# Patient Record
Sex: Female | Born: 1960 | Hispanic: No | Marital: Married
Health system: Southern US, Community
[De-identification: ages and names within clinical notes are randomized; demographics above are authoritative.]

## PROBLEM LIST (undated history)

## (undated) DIAGNOSIS — R000 Tachycardia, unspecified: Secondary | ICD-10-CM

---

## 2020-01-27 ENCOUNTER — Emergency Department (HOSPITAL_COMMUNITY)
Admission: EM | Admit: 2020-01-27 | Discharge: 2020-01-27 | Disposition: A | Discharge status: Home / Self Care | Payer: No Typology Code available for payment source | Attending: Emergency Medicine | Admitting: Emergency Medicine

## 2020-01-27 ENCOUNTER — Encounter (HOSPITAL_COMMUNITY): Payer: Self-pay | Admitting: Emergency Medicine

## 2020-01-27 ENCOUNTER — Emergency Department (HOSPITAL_COMMUNITY): Payer: No Typology Code available for payment source

## 2020-01-27 DIAGNOSIS — Z79899 Other long term (current) drug therapy: Secondary | ICD-10-CM | POA: Diagnosis not present

## 2020-01-27 DIAGNOSIS — Y92410 Unspecified street and highway as the place of occurrence of the external cause: Secondary | ICD-10-CM | POA: Insufficient documentation

## 2020-01-27 DIAGNOSIS — I10 Essential (primary) hypertension: Secondary | ICD-10-CM | POA: Insufficient documentation

## 2020-01-27 DIAGNOSIS — M79652 Pain in left thigh: Secondary | ICD-10-CM | POA: Diagnosis not present

## 2020-01-27 DIAGNOSIS — M546 Pain in thoracic spine: Secondary | ICD-10-CM | POA: Diagnosis not present

## 2020-01-27 DIAGNOSIS — Y999 Unspecified external cause status: Secondary | ICD-10-CM | POA: Diagnosis not present

## 2020-01-27 DIAGNOSIS — Y9389 Activity, other specified: Secondary | ICD-10-CM | POA: Insufficient documentation

## 2020-01-27 DIAGNOSIS — M549 Dorsalgia, unspecified: Secondary | ICD-10-CM

## 2020-01-27 HISTORY — DX: Tachycardia, unspecified: R00.0

## 2020-01-27 MED ORDER — METHOCARBAMOL 500 MG PO TABS: 500.0000 mg | ORAL_TABLET | Freq: Every evening | ORAL | 0 refills | Status: AC | PRN

## 2020-01-27 MED ORDER — DICLOFENAC SODIUM 1 % EX GEL: 2.0000 g | Freq: Four times a day (QID) | CUTANEOUS | 0 refills | Status: AC

## 2020-01-27 NOTE — Discharge Instructions (Signed)
Use the volatren gel to help with pain.  Take ibuprofen 3 times a day with meals as needed for pain.  Do not take other anti-inflammatories at the same time (Advil, Motrin, naproxen, Aleve). You may supplement with Tylenol if you need further pain control. Use robaxin as needed for muscle stiffness or soreness.  Have caution, this may make you tired or groggy.  Do not drive or operate heavy machinery while taking this medicine. Use ice packs or heating pads if this helps control your pain. You will likely have continued muscle stiffness and soreness over the next couple days.   Return to the emergency room if you develop vision changes, vomiting, slurred speech, numbness, loss of bowel or bladder control, or any new or worsening symptoms.   Prdorni xhelin Volatren pr t ndihmuar me dhimbjen. Merrni ibuprofen 3 her n dit me vakte sipas nevojs pr dhimbje. Mos merrni antiinflamator t tjer n t njjtn koh (Advil, Motrin, naproxen, Aleve). Ju mund t plotsoni Tylenol nse keni nevoj pr kontroll t mtejshm t dhimbjes. Prdorni robaxin sipas nevojs pr ngurtsimin ose dhimbjen e muskujve. Kini kujdes, kjo mund t'ju bj t lodhur ose Teacher, early years/pre. Mos drejtoni ose prdorni makineri t rnda gjat marrjes s ktij ilai. Prdorni pako akulli ose jastk ngrohje nse kjo ju ndihmon t kontrolloni dhimbjen tuaj. Ju ka t ngjar t keni ngurtsi dhe dhimbje t vazhdueshme t muskujve gjat dy ditve t ardhshme. Kthehuni n dhomn e urgjencs nse keni ndryshime n shikim, t vjella, t folur t paqart, mpirje, humbje t kontrollit t zorrve ose fshikzs ose ndonj simptom t re ose t prkeqsuar.
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
## 2020-01-27 NOTE — ED Notes (Signed)
Pt taken to xray 
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
## 2022-11-04 ENCOUNTER — Ambulatory Visit: Payer: Self-pay
